# Patient Record
Sex: Female | Born: 1980 | Race: White | Hispanic: No | Smoking: Never smoker
Health system: Southern US, Community
[De-identification: ages and names within clinical notes are randomized; demographics above are authoritative.]

## PROBLEM LIST (undated history)

## (undated) HISTORY — PX: KNEE SURGERY: SHX244

## (undated) HISTORY — PX: BREAST REDUCTION SURGERY: SHX8

---

## 1999-09-24 ENCOUNTER — Other Ambulatory Visit: Admission: RE | Admit: 1999-09-24 | Discharge: 1999-09-24 | Payer: Self-pay | Admitting: Obstetrics and Gynecology

## 2000-06-27 ENCOUNTER — Encounter: Admission: RE | Admit: 2000-06-27 | Discharge: 2000-06-27 | Payer: Self-pay

## 2000-09-25 ENCOUNTER — Other Ambulatory Visit: Admission: RE | Admit: 2000-09-25 | Discharge: 2000-09-25 | Payer: Self-pay | Admitting: Obstetrics and Gynecology

## 2001-10-01 ENCOUNTER — Other Ambulatory Visit: Admission: RE | Admit: 2001-10-01 | Discharge: 2001-10-01 | Payer: Self-pay | Admitting: Obstetrics and Gynecology

## 2002-06-13 ENCOUNTER — Emergency Department (HOSPITAL_COMMUNITY): Admission: EM | Admit: 2002-06-13 | Discharge: 2002-06-14 | Payer: Self-pay | Admitting: Emergency Medicine

## 2002-10-16 ENCOUNTER — Other Ambulatory Visit: Admission: RE | Admit: 2002-10-16 | Discharge: 2002-10-16 | Payer: Self-pay | Admitting: Obstetrics and Gynecology

## 2003-10-20 ENCOUNTER — Other Ambulatory Visit: Admission: RE | Admit: 2003-10-20 | Discharge: 2003-10-20 | Payer: Self-pay | Admitting: Obstetrics and Gynecology

## 2018-12-04 ENCOUNTER — Emergency Department (HOSPITAL_BASED_OUTPATIENT_CLINIC_OR_DEPARTMENT_OTHER)
Admission: EM | Admit: 2018-12-04 | Discharge: 2018-12-05 | Disposition: A | Discharge status: Home / Self Care | Payer: BC Managed Care – PPO | Attending: Emergency Medicine | Admitting: Emergency Medicine

## 2018-12-04 ENCOUNTER — Emergency Department (HOSPITAL_BASED_OUTPATIENT_CLINIC_OR_DEPARTMENT_OTHER): Payer: BC Managed Care – PPO

## 2018-12-04 ENCOUNTER — Other Ambulatory Visit: Payer: Self-pay

## 2018-12-04 ENCOUNTER — Encounter (HOSPITAL_BASED_OUTPATIENT_CLINIC_OR_DEPARTMENT_OTHER): Payer: Self-pay

## 2018-12-04 DIAGNOSIS — Z20822 Contact with and (suspected) exposure to covid-19: Secondary | ICD-10-CM

## 2018-12-04 DIAGNOSIS — R55 Syncope and collapse: Secondary | ICD-10-CM

## 2018-12-04 DIAGNOSIS — Z79899 Other long term (current) drug therapy: Secondary | ICD-10-CM | POA: Insufficient documentation

## 2018-12-04 DIAGNOSIS — Z20828 Contact with and (suspected) exposure to other viral communicable diseases: Secondary | ICD-10-CM | POA: Insufficient documentation

## 2018-12-04 NOTE — ED Triage Notes (Addendum)
C/o dizziness, visual changes, muscle cramps, lightheaded x 10 days-pt seen at [facility] last week and advised the only abnormal test was low iron-pt had drive thru covid test at [facility] today-NAD-to triage in w/c

## 2018-12-05 LAB — CBC WITH DIFFERENTIAL/PLATELET
Abs Immature Granulocytes: 0 10*3/uL (ref 0.00–0.07)
Basophils Absolute: 0.1 10*3/uL (ref 0.0–0.1)
Basophils Relative: 1 %
Eosinophils Absolute: 0.1 10*3/uL (ref 0.0–0.5)
Eosinophils Relative: 2 %
HCT: 38.6 % (ref 36.0–46.0)
Hemoglobin: 11.7 g/dL — ABNORMAL LOW (ref 12.0–15.0)
Immature Granulocytes: 0 %
Lymphocytes Relative: 36 %
Lymphs Abs: 2.6 10*3/uL (ref 0.7–4.0)
MCH: 26.9 pg (ref 26.0–34.0)
MCHC: 30.3 g/dL (ref 30.0–36.0)
MCV: 88.7 fL (ref 80.0–100.0)
Monocytes Absolute: 0.5 10*3/uL (ref 0.1–1.0)
Monocytes Relative: 7 %
Neutro Abs: 4 10*3/uL (ref 1.7–7.7)
Neutrophils Relative %: 54 %
Platelets: 289 10*3/uL (ref 150–400)
RBC: 4.35 MIL/uL (ref 3.87–5.11)
RDW: 12.8 % (ref 11.5–15.5)
WBC: 7.2 10*3/uL (ref 4.0–10.5)
nRBC: 0 % (ref 0.0–0.2)

## 2018-12-05 LAB — URINALYSIS, ROUTINE W REFLEX MICROSCOPIC
Bilirubin Urine: NEGATIVE
Glucose, UA: NEGATIVE mg/dL
Hgb urine dipstick: NEGATIVE
Ketones, ur: NEGATIVE mg/dL
Leukocytes,Ua: NEGATIVE
Nitrite: NEGATIVE
Protein, ur: NEGATIVE mg/dL
Specific Gravity, Urine: >1.03 — ABNORMAL HIGH (ref 1.005–1.030)
pH: 5.5 (ref 5.0–8.0)

## 2018-12-05 LAB — PREGNANCY, URINE: Preg Test, Ur: NEGATIVE

## 2018-12-05 NOTE — ED Provider Notes (Signed)
[facility] EMERGENCY DEPARTMENT Provider Note   CSN: [id number] Arrival date & time: 12/04/18  1950     History   Chief Complaint Chief Complaint  Patient presents with   Dizziness    HPI Lauren Ayala is a 38 y.o. female.     The history is provided by the patient.  Near Syncope This is a new problem. The current episode started more than 1 week ago. The problem occurs constantly. The problem has not changed since onset.Pertinent negatives include no chest pain, no abdominal pain, no headaches and no shortness of breath. Nothing aggravates the symptoms. Nothing relieves the symptoms. She has tried nothing for the symptoms. The treatment provided no relief.  Patient with no significant PMH but was exposed to someone who tested positive for COVID several weeks ago presents with lightheadedness and feeling she may pass out, this feeling has been ongoing for approximately 10 days.  No weakness, nor numbness.  No changes in vision, speech or cognition.  No chest pain.  No exertional dyspnea or pain, no dyspnea at rest.  No cough, no sore throat.  No rashes on the skin.  No neck pain or stiffness.  No back pain.  She saw urgent care and they did blood work and she has normal labs except low low but hemoglobin was normal. She was tested for COVID and the results is pending.  She wonders if this could be anxiety.   History reviewed. No pertinent past medical history.  There are no active problems to display for this patient.   Past Surgical History:  Procedure Laterality Date   BREAST REDUCTION SURGERY     CESAREAN SECTION     KNEE SURGERY       OB History   No obstetric history on file.      Home Medications    Prior to Admission medications   Medication Sig Start Date End Date Taking? Authorizing Provider  ferrous sulfate 325 (65 FE) MG tablet Take 325 mg by mouth daily with breakfast.   Yes [provider]  loratadine (CLARITIN) 10 MG tablet Take  10 mg by mouth daily.   Yes [provider]    Family History No family history on file.  Social History Social History   Tobacco Use   Smoking status: Never Smoker   Smokeless tobacco: Never Used  Substance Use Topics   Alcohol use: Yes    Comment: occ   Drug use: Never     Allergies   Patient has no known allergies.   Review of Systems Review of Systems  Constitutional: Negative for diaphoresis and fever.  HENT: Negative for sore throat and trouble swallowing.   Respiratory: Negative for cough, chest tightness and shortness of breath.   Cardiovascular: Positive for near-syncope. Negative for chest pain, palpitations and leg swelling.  Gastrointestinal: Negative for abdominal pain, constipation, diarrhea and nausea.  Genitourinary: Negative for dysuria.  Musculoskeletal: Negative for back pain, myalgias, neck pain and neck stiffness.  Neurological: Positive for light-headedness. Negative for dizziness, syncope, facial asymmetry, speech difficulty, weakness, numbness and headaches.  Psychiatric/Behavioral: Negative for confusion.  All other systems reviewed and are negative.    Physical Exam Updated Vital Signs BP 139/83 (BP Location: Left Arm)    Pulse 68    Temp 98.8 F (37.1 C) (Oral)    Resp 18    Ht 5\' 8"  (1.727 m)    Wt 86.2 kg    LMP 11/30/2018    SpO2 100%  BMI 28.89 kg/m   Physical Exam Vitals signs and nursing note reviewed.  Constitutional:      Appearance: Normal appearance. She is not ill-appearing or diaphoretic.  HENT:     Head: Normocephalic and atraumatic.     Right Ear: Tympanic membrane normal.     Left Ear: Tympanic membrane normal.     Nose: Nose normal.  Eyes:     Conjunctiva/sclera: Conjunctivae normal.     Pupils: Pupils are equal, round, and reactive to light.  Neck:     Musculoskeletal: Normal range of motion and neck supple.  Cardiovascular:     Rate and Rhythm: Normal rate and regular rhythm.     Pulses: Normal  pulses.     Heart sounds: Normal heart sounds.  Pulmonary:     Effort: Pulmonary effort is normal. No respiratory distress.     Breath sounds: Normal breath sounds. No wheezing or rales.  Abdominal:     General: Abdomen is flat. Bowel sounds are normal.     Palpations: Abdomen is soft.     Tenderness: There is no abdominal tenderness. There is no guarding.  Musculoskeletal: Normal range of motion.  Skin:    General: Skin is warm and dry.     Capillary Refill: Capillary refill takes less than 2 seconds.     Findings: No bruising or rash.  Neurological:     General: No focal deficit present.     Mental Status: She is alert and oriented to person, place, and time.     Cranial Nerves: No cranial nerve deficit.     Motor: No weakness.     Coordination: Coordination normal.     Deep Tendon Reflexes: Reflexes normal.  Psychiatric:        Thought Content: Thought content normal.      ED Treatments / Results  Labs (all labs ordered are listed, but only abnormal results are displayed) Results for orders placed or performed during the hospital encounter of 12/04/18  Pregnancy, urine  Result Value Ref Range   Preg Test, Ur NEGATIVE NEGATIVE  Urinalysis, Routine w reflex microscopic  Result Value Ref Range   Color, Urine YELLOW YELLOW   APPearance CLOUDY (A) CLEAR   Specific Gravity, Urine >1.030 (H) 1.005 - 1.030   pH 5.5 5.0 - 8.0   Glucose, UA NEGATIVE NEGATIVE mg/dL   Hgb urine dipstick NEGATIVE NEGATIVE   Bilirubin Urine NEGATIVE NEGATIVE   Ketones, ur NEGATIVE NEGATIVE mg/dL   Protein, ur NEGATIVE NEGATIVE mg/dL   Nitrite NEGATIVE NEGATIVE   Leukocytes,Ua NEGATIVE NEGATIVE  CBC with Differential/Platelet  Result Value Ref Range   WBC 7.2 4.0 - 10.5 K/uL   RBC 4.35 3.87 - 5.11 MIL/uL   Hemoglobin 11.7 (L) 12.0 - 15.0 g/dL   HCT 38.6 36.0 - 46.0 %   MCV 88.7 80.0 - 100.0 fL   MCH 26.9 26.0 - 34.0 pg   MCHC 30.3 30.0 - 36.0 g/dL   RDW 12.8 11.5 - 15.5 %   Platelets 289  150 - 400 K/uL   nRBC 0.0 0.0 - 0.2 %   Neutrophils Relative % 54 %   Neutro Abs 4.0 1.7 - 7.7 K/uL   Lymphocytes Relative 36 %   Lymphs Abs 2.6 0.7 - 4.0 K/uL   Monocytes Relative 7 %   Monocytes Absolute 0.5 0.1 - 1.0 K/uL   Eosinophils Relative 2 %   Eosinophils Absolute 0.1 0.0 - 0.5 K/uL   Basophils Relative 1 %  Basophils Absolute 0.1 0.0 - 0.1 K/uL   Immature Granulocytes 0 %   Abs Immature Granulocytes 0.00 0.00 - 0.07 K/uL   Ct Head Wo Contrast  Result Date: 12/04/2018 CLINICAL DATA:  Dizziness. EXAM: CT HEAD WITHOUT CONTRAST TECHNIQUE: Contiguous axial images were obtained from the base of the skull through the vertex without intravenous contrast. COMPARISON:  None. FINDINGS: Brain: No intracranial hemorrhage, mass effect, or midline shift. No hydrocephalus. The basilar cisterns are patent. No evidence of territorial infarct or acute ischemia. No extra-axial or intracranial fluid collection. Vascular: No hyperdense vessel or unexpected calcification. Skull: Normal. Negative for fracture or focal lesion. Sinuses/Orbits: Paranasal sinuses and mastoid air cells are clear. The visualized orbits are unremarkable. Other: None. IMPRESSION: Unremarkable noncontrast head CT. Electronically Signed   By: Narda RutherfordMelanie  Sanford M.D.   On: 12/04/2018 23:43   Dg Chest Portable 1 View  Result Date: 12/04/2018 CLINICAL DATA:  Dizziness EXAM: PORTABLE CHEST 1 VIEW COMPARISON:  None. FINDINGS: The heart size and mediastinal contours are within normal limits. Both lungs are clear. The visualized skeletal structures are unremarkable. IMPRESSION: No active disease. Electronically Signed   By: Alcide CleverMark  Lukens M.D.   On: 12/04/2018 23:42  '  EKG  Date: 12/05/2018  Rate: 70  Rhythm: normal sinus rhythm  QRS Axis: normal  Intervals: normal  ST/T Wave abnormalities: normal  Conduction Disutrbances: none  Narrative Interpretation: unremarkable   ; Radiology Ct Head Wo Contrast  Result Date:  12/04/2018 CLINICAL DATA:  Dizziness. EXAM: CT HEAD WITHOUT CONTRAST TECHNIQUE: Contiguous axial images were obtained from the base of the skull through the vertex without intravenous contrast. COMPARISON:  None. FINDINGS: Brain: No intracranial hemorrhage, mass effect, or midline shift. No hydrocephalus. The basilar cisterns are patent. No evidence of territorial infarct or acute ischemia. No extra-axial or intracranial fluid collection. Vascular: No hyperdense vessel or unexpected calcification. Skull: Normal. Negative for fracture or focal lesion. Sinuses/Orbits: Paranasal sinuses and mastoid air cells are clear. The visualized orbits are unremarkable. Other: None. IMPRESSION: Unremarkable noncontrast head CT. Electronically Signed   By: Narda RutherfordMelanie  Sanford M.D.   On: 12/04/2018 23:43   Dg Chest Portable 1 View  Result Date: 12/04/2018 CLINICAL DATA:  Dizziness EXAM: PORTABLE CHEST 1 VIEW COMPARISON:  None. FINDINGS: The heart size and mediastinal contours are within normal limits. Both lungs are clear. The visualized skeletal structures are unremarkable. IMPRESSION: No active disease. Electronically Signed   By: Alcide CleverMark  Lukens M.D.   On: 12/04/2018 23:42    Procedures Procedures (including critical care time)  Orthostatic VS for the past 24 hrs:  BP- Lying Pulse- Lying BP- Sitting Pulse- Sitting BP- Standing at 0 minutes Pulse- Standing at 0 minutes  12/04/18 2343 139/83 73 134/83 71 (!) 136/92 73   PERC negative Wells 0 highy doubt PE in this low risk patient.    All intervals on EKG are normal.  No signs of IHHS nor Brugada.  There is no prolongation of the QT.  No conduction defects.  The patient is not orthostatic and shows no signs of dehydration.  I suspect this may be related to her COVID exposure or anxiety regarding the exposure.  The covid test is pending at this time.  She is to be on home isolation pending test results.  No f/c/r.  No hypoxia.  No indication to repeat test nor indication  for hospitalization.  Will refer to neurology as an outpatient for further evaluation as I do not see an electrophysiology cause of  symptoms that are ongoing as we speak.  Lauren Ayala.    Lauren Ayala was evaluated in Emergency Department on 12/05/2018 for the symptoms described in the history of present illness. She was evaluated in the context of the global COVID-19 pandemic, which necessitated consideration that the patient might be at risk for infection with the SARS-CoV-2 virus that causes COVID-19. Institutional protocols and algorithms that pertain to the evaluation of patients at risk for COVID-19 are in a state of rapid change based on information released by regulatory bodies including the CDC and federal and state organizations. These policies and algorithms were followed during the patient's care in the ED.  Final Clinical Impressions(s) / ED Diagnoses   Return for intractable cough, coughing up blood,fevers >100.4 unrelieved by medication, shortness of breath, intractable vomiting, chest pain, shortness of breath, weakness,numbness, changes in speech, facial asymmetry,abdominal pain, passing out,Inability to tolerate liquids or food, cough, altered mental status or any concerns. No signs of systemic illness or infection. The patient is nontoxic-appearing on exam and vital signs are within normal limits.   I have reviewed the triage vital signs and the nursing notes. Pertinent labs &imaging results that were available during my care of the patient were reviewed by me and considered in my medical decision making (see chart for details).  After history, exam, and medical workup I feel the patient has been appropriately medically screened and is safe for discharge home. Pertinent diagnoses were discussed with the patient. Patient was given return precautions   Shonice Wrisley, MD 12/05/18 [id number]

## 2018-12-05 NOTE — Discharge Instructions (Signed)
Lauren Ayala was evaluated in Emergency Department on 12/05/2018 for the symptoms described in the history of present illness. She was evaluated in the context of the global COVID-19 pandemic, which necessitated consideration that the patient might be at risk for infection with the SARS-CoV-2 virus that causes COVID-19. Institutional protocols and algorithms that pertain to the evaluation of patients at risk for COVID-19 are in a state of rapid change based on information released by regulatory bodies including the CDC and federal and state organizations. These policies and algorithms were followed during the patient's care in the ED.

## 2021-03-24 IMAGING — CT CT HEAD WITHOUT CONTRAST
3 series · 15 of 47 positions shown, 18 images · non-contrast
Comparison: None.

CLINICAL DATA: Dizziness.

EXAM:
CT HEAD WITHOUT CONTRAST
TECHNIQUE: Contiguous axial images were obtained from the base of the skull
through the vertex without intravenous contrast.

[Series 2: head wo · axial · 0.46mm/px · z∈[-179,-49]mm · 9 of 32 slices shown, 12 images]
[im 3/32  brain]
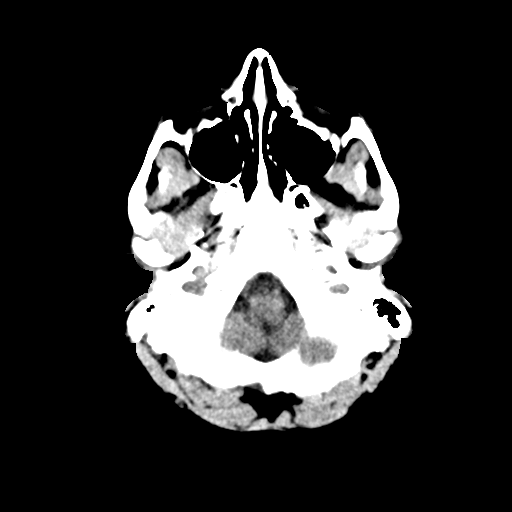
[im 3/32  bone]
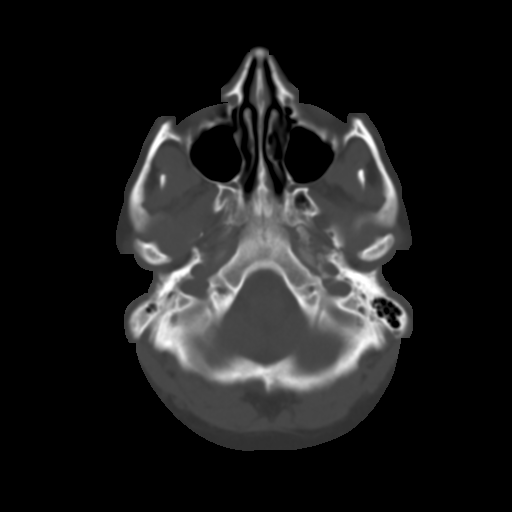
[im 6/32  brain]
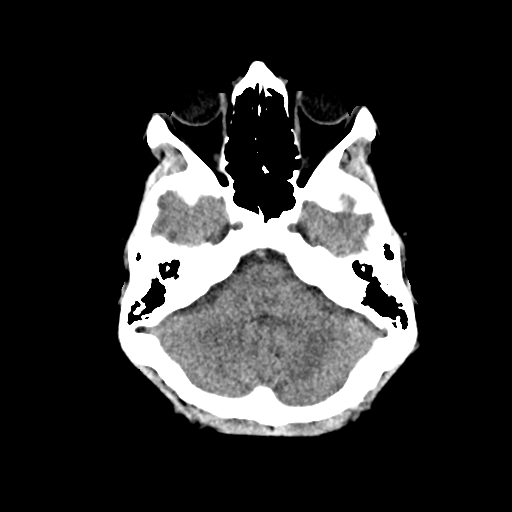
[im 9/32  brain]
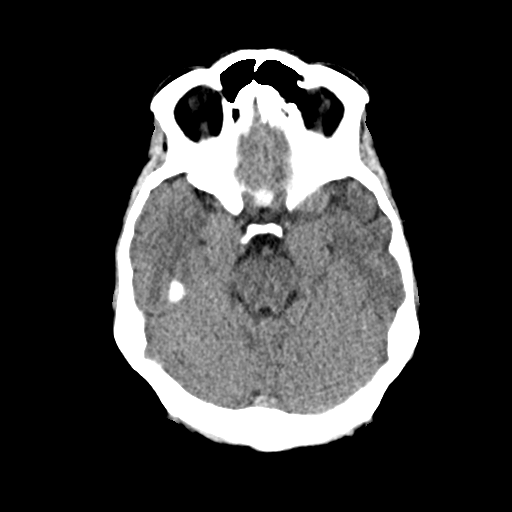
[im 12/32  brain]
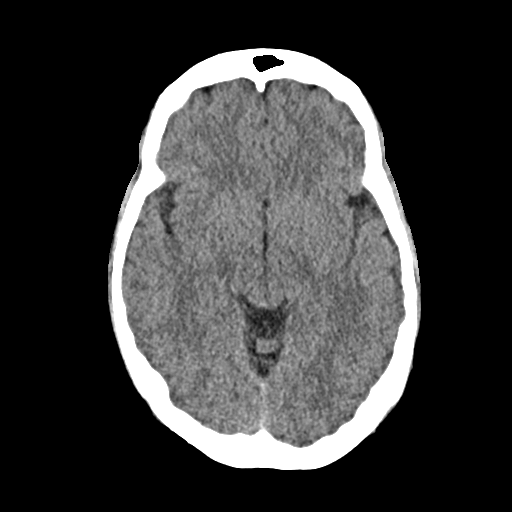
[im 17/32  brain]
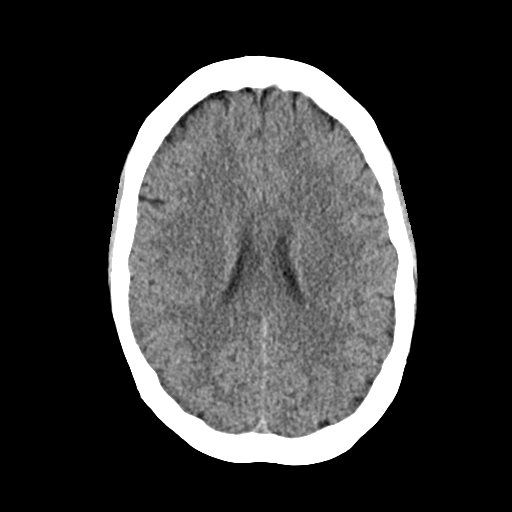
[im 17/32  bone]
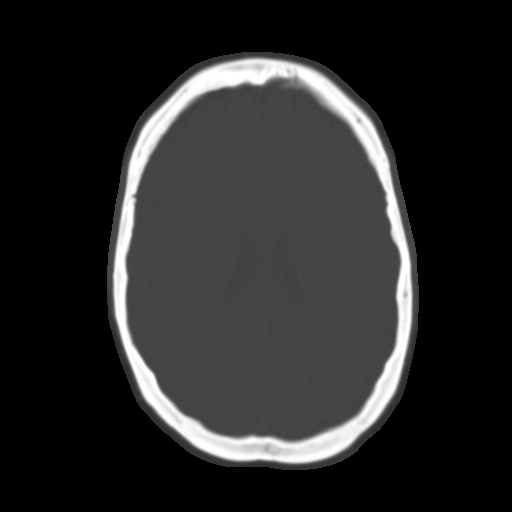
[im 20/32  brain]
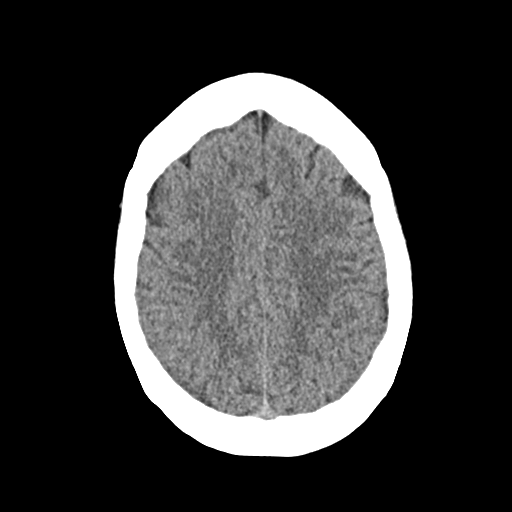
[im 23/32  brain]
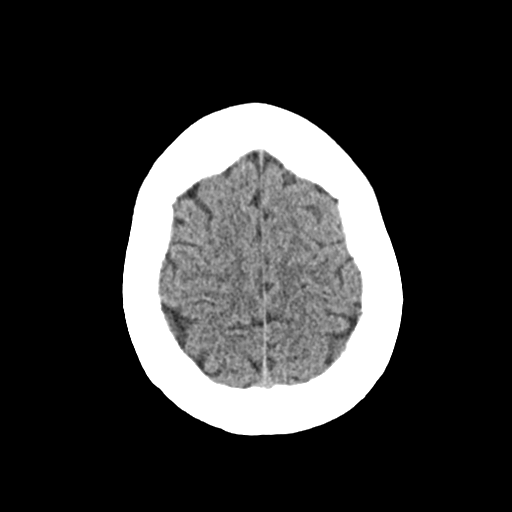
[im 26/32  brain]
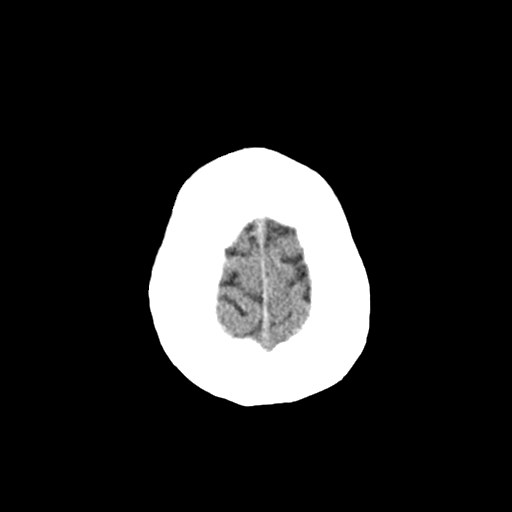
[im 29/32  brain]
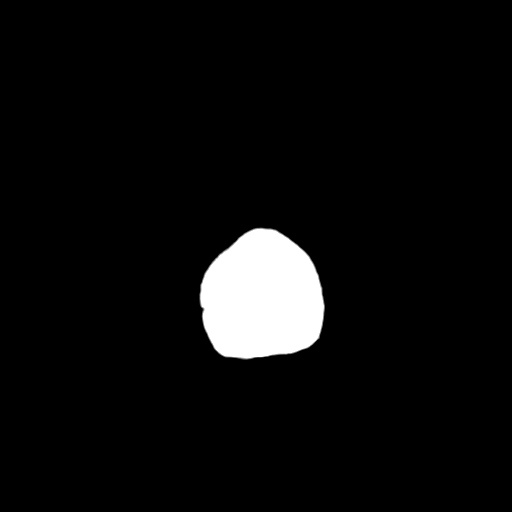
[im 29/32  bone]
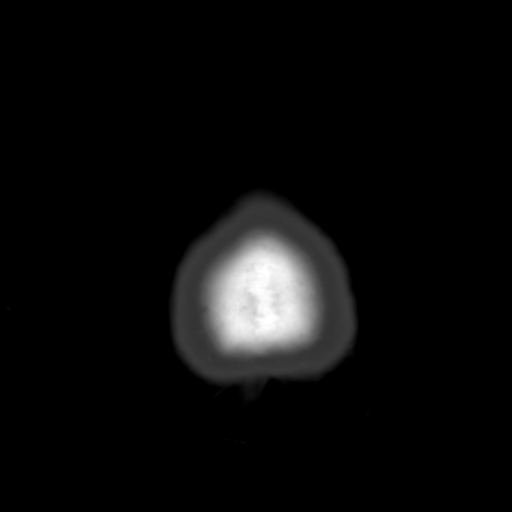

[Series 4: coronal soft · coronal · 0.31mm/px · 3 of 68 slices shown]
[im 23/68  brain]
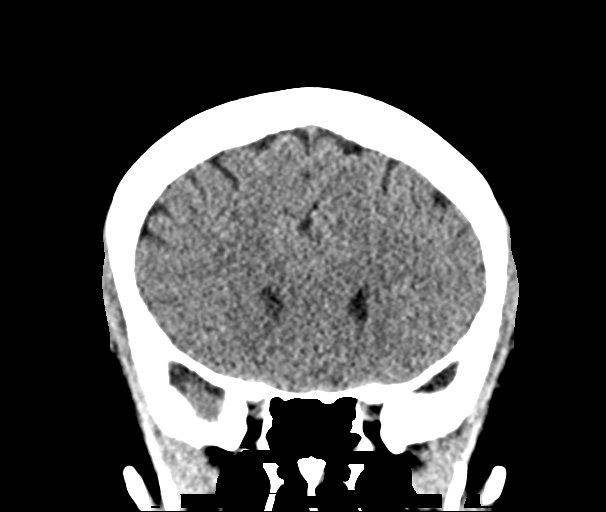
[im 30/68  brain]
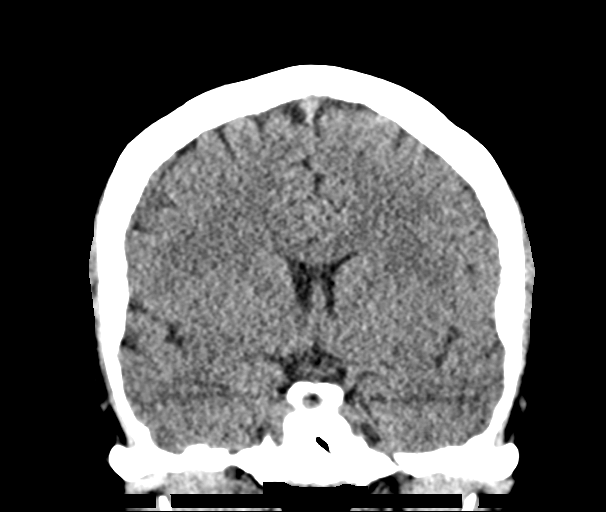
[im 38/68  brain]
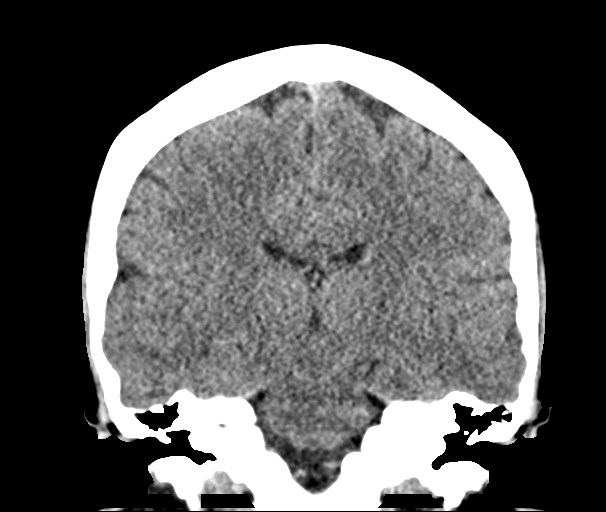

[Series 5: sag soft · sagittal · 0.34mm/px · 3 of 52 slices shown]
[im 18/52  brain]
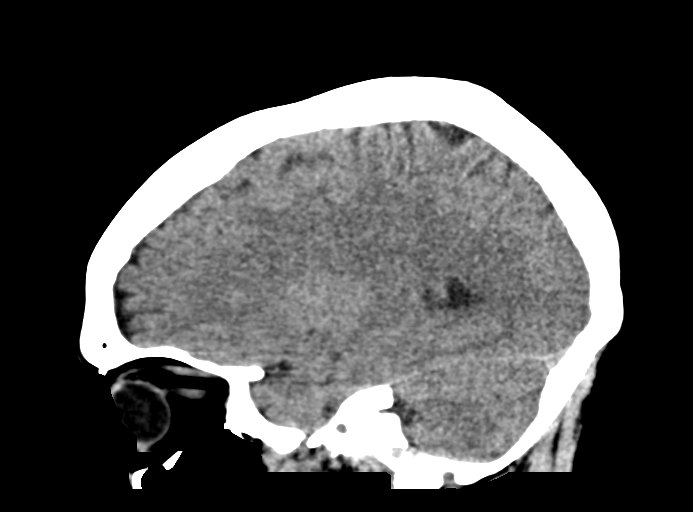
[im 26/52  brain]
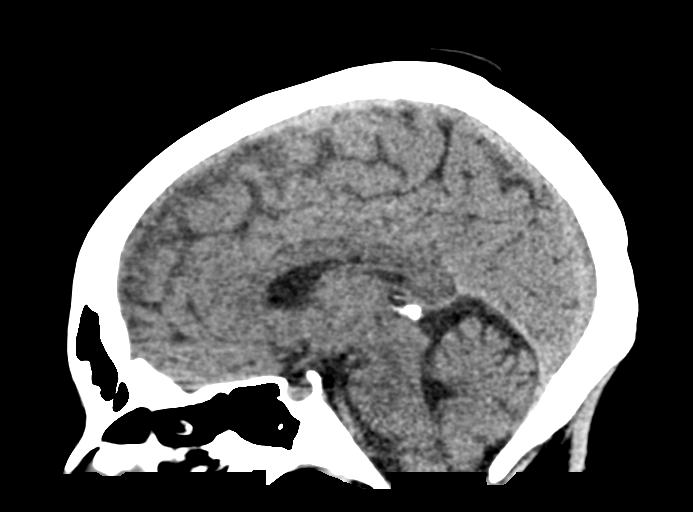
[im 35/52  brain]
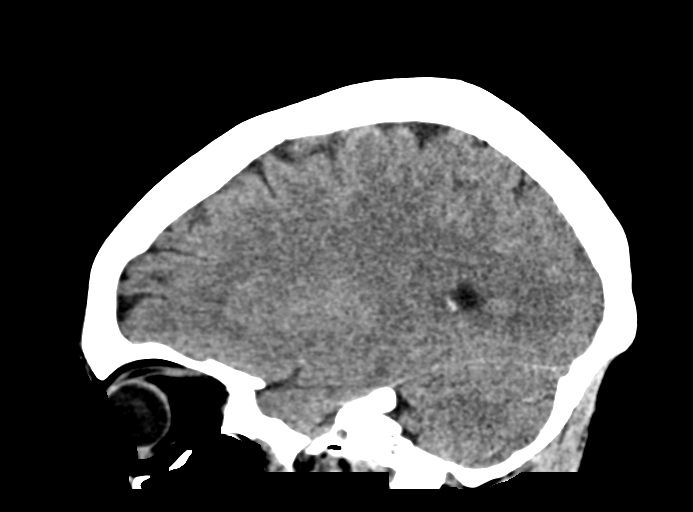

[15 of 47 positions shown; findings below may reference images not displayed]

FINDINGS: Brain: No intracranial hemorrhage, mass effect, or midline shift. No
hydrocephalus. The basilar cisterns are patent. No evidence of
territorial infarct or acute ischemia. No extra-axial or
intracranial fluid collection.

Vascular: No hyperdense vessel or unexpected calcification.

Skull: Normal. Negative for fracture or focal lesion.

Sinuses/Orbits: Paranasal sinuses and mastoid air cells are clear.
The visualized orbits are unremarkable.

Other: None.
IMPRESSION: Unremarkable noncontrast head CT.
